# Patient Record
Sex: Male | Born: 1980 | Race: White | Hispanic: No | Marital: Single
Health system: Midwestern US, Community
[De-identification: ages and names within clinical notes are randomized; demographics above are authoritative.]

---

## 2014-02-05 ENCOUNTER — Emergency Department (HOSPITAL_BASED_OUTPATIENT_CLINIC_OR_DEPARTMENT_OTHER)
Admission: EM | Admit: 2014-02-05 | Discharge: 2014-02-05 | Disposition: A | Discharge status: Home / Self Care | Attending: Emergency Medicine | Admitting: Emergency Medicine

## 2014-02-05 ENCOUNTER — Emergency Department (HOSPITAL_BASED_OUTPATIENT_CLINIC_OR_DEPARTMENT_OTHER)

## 2014-02-05 ENCOUNTER — Encounter (HOSPITAL_BASED_OUTPATIENT_CLINIC_OR_DEPARTMENT_OTHER): Payer: Self-pay | Admitting: Emergency Medicine

## 2014-02-05 DIAGNOSIS — R079 Chest pain, unspecified: Secondary | ICD-10-CM

## 2014-02-05 DIAGNOSIS — R0789 Other chest pain: Secondary | ICD-10-CM | POA: Insufficient documentation

## 2014-02-05 DIAGNOSIS — Z791 Long term (current) use of non-steroidal anti-inflammatories (NSAID): Secondary | ICD-10-CM | POA: Diagnosis not present

## 2014-02-05 DIAGNOSIS — Z720 Tobacco use: Secondary | ICD-10-CM | POA: Insufficient documentation

## 2014-02-05 DIAGNOSIS — R52 Pain, unspecified: Secondary | ICD-10-CM

## 2014-02-05 MED ORDER — NAPROXEN 500 MG PO TABS: 500.0000 mg | ORAL_TABLET | Freq: Two times a day (BID) | ORAL | Status: AC

## 2014-02-05 NOTE — ED Notes (Signed)
MD at bedside discussing results with the patient

## 2014-02-05 NOTE — ED Provider Notes (Signed)
CSN: [id number]     Arrival date & time 02/05/14  7829 History   First MD Initiated Contact with Patient 02/05/14 [phone]     Chief Complaint  Patient presents with  . Chest Pain     HPI  Vision presents for evaluation of chest pain. Describes symptoms in January 2. He just moved back from [place]. He is active duty Hotel manager. Pain is brief and intermittent. He states it happens anywhere from every other day to twice per day. Last less than 1 minute at a time to sharp and low parasternal. Not pleuritic. Not predictable. He still runs every morning for his PT without symptoms. No shortness of breath no radiation to his neck back or jaw. No cough sputum or difficulty breathing fever shakes chills. No GI complaints. History reviewed. No pertinent past medical history. History reviewed. No pertinent past surgical history. History reviewed. No pertinent family history. History  Substance Use Topics  . Smoking status: Current Some Day Smoker    Types: Cigarettes    Last Attempt to Quit: 02/03/2014  . Smokeless tobacco: Not on file  . Alcohol Use: No    Review of Systems  Constitutional: Negative for fever, chills, diaphoresis, appetite change and fatigue.  HENT: Negative for mouth sores, sore throat and trouble swallowing.   Eyes: Negative for visual disturbance.  Respiratory: Negative for cough, chest tightness, shortness of breath and wheezing.   Cardiovascular: Positive for chest pain.  Gastrointestinal: Negative for nausea, vomiting, abdominal pain, diarrhea and abdominal distention.  Endocrine: Negative for polydipsia, polyphagia and polyuria.  Genitourinary: Negative for dysuria, frequency and hematuria.  Musculoskeletal: Negative for gait problem.  Skin: Negative for color change, pallor and rash.  Neurological: Negative for dizziness, syncope, light-headedness and headaches.  Hematological: Does not bruise/bleed easily.  Psychiatric/Behavioral: Negative for behavioral problems and  confusion.      Allergies  Review of patient's allergies indicates no known allergies.  Home Medications   Prior to Admission medications   Medication Sig Start Date End Date Taking? Authorizing Provider  naproxen (NAPROSYN) 500 MG tablet Take 1 tablet (500 mg total) by mouth 2 (two) times daily. 02/05/14   Rolland Porter, MD   BP 165/95 mmHg  Pulse 90  Temp(Src) 97.8 F (36.6 C) (Oral)  Resp 16  SpO2 100% Physical Exam  Constitutional: He is oriented to person, place, and time. He appears well-developed and well-nourished. No distress.  HENT:  Head: Normocephalic.  Eyes: Conjunctivae are normal. Pupils are equal, round, and reactive to light. No scleral icterus.  Neck: Normal range of motion. Neck supple. No thyromegaly present.  Cardiovascular: Normal rate and regular rhythm.  Exam reveals no gallop and no friction rub.   No murmur heard. Pulmonary/Chest: Effort normal and breath sounds normal. No respiratory distress. He has no wheezes. He has no rales.    Normal exam. Clear lungs. No crackles or rales.  Abdominal: Soft. Bowel sounds are normal. He exhibits no distension. There is no tenderness. There is no rebound.  Musculoskeletal: Normal range of motion.  Neurological: He is alert and oriented to person, place, and time.  Skin: Skin is warm and dry. No rash noted.  Psychiatric: He has a normal mood and affect. His behavior is normal.    ED Course  Procedures (including critical care time) Labs Review Labs Reviewed - No data to display  Imaging Review Dg Chest 2 View  02/05/2014   CLINICAL DATA:  Dull intermittent chest pain since 01/25/2013, lasting for only a  couple of min. Recently quit smoking.  EXAM: CHEST  2 VIEW  COMPARISON:  None.  FINDINGS: Cardiomediastinal silhouette is within normal limits. The lungs are well inflated with mild bronchovascular thickening bilaterally. No confluent airspace opacity, overt pulmonary edema, pleural effusion, or pneumothorax is  identified. No acute osseous abnormality is identified.  IMPRESSION: Peribronchial thickening which may reflect bronchitis or reactive airways disease.   Electronically Signed   By: Sebastian AcheAllen  Grady   On: 02/05/2014 07:18     EKG Interpretation   Date/Time:  Wednesday February 05 2014 06:48:39 EST Ventricular Rate:  79 PR Interval:  146 QRS Duration: 94 QT Interval:  376 QTC Calculation: 431 R Axis:   45 Text Interpretation:  Normal sinus rhythm Confirmed by [facility]  [facility],  APRIL ([id number]) on 02/05/2014 6:52:19 AM      MDM   Final diagnoses:  Chest wall pain  Chest pain, unspecified chest pain type    Normal exam. Pain is brief. Less than 1 minute at a time. Not predictable. No pain with exertion. Different activity, but no heart or strenuous activity. Continues to be asymptomatic despite running to 3 miles per day. Not a new activity for him. Normal exam negative chest x-ray and EKG. No risk for ACS. No sign of pneumothorax, pneumomediastinum, or pneumonia. Plan will be treatment with anti-inflammatories in attention is symptoms. Recheck with any worsening symptoms.    Rolland PorterMark Marieann Zipp, MD 02/05/14 [phone]

## 2014-02-05 NOTE — Discharge Instructions (Signed)
Chest Wall Pain °Chest wall pain is pain in or around the bones and muscles of your chest. It may take up to 6 weeks to get better. It may take longer if you must stay physically active in your work and activities.  °CAUSES  °Chest wall pain may happen on its own. However, it may be caused by: °· A viral illness like the flu. °· Injury. °· Coughing. °· Exercise. °· Arthritis. °· Fibromyalgia. °· Shingles. °HOME CARE INSTRUCTIONS  °· Avoid overtiring physical activity. Try not to strain or perform activities that cause pain. This includes any activities using your chest or your abdominal and side muscles, especially if heavy weights are used. °· Put ice on the sore area. °· Put ice in a plastic bag. °· Place a towel between your skin and the bag. °· Leave the ice on for 15-20 minutes per hour while awake for the first 2 days. °· Only take over-the-counter or prescription medicines for pain, discomfort, or fever as directed by your caregiver. °SEEK IMMEDIATE MEDICAL CARE IF:  °· Your pain increases, or you are very uncomfortable. °· You have a fever. °· Your chest pain becomes worse. °· You have new, unexplained symptoms. °· You have nausea or vomiting. °· You feel sweaty or lightheaded. °· You have a cough with phlegm (sputum), or you cough up blood. °MAKE SURE YOU:  °· Understand these instructions. °· Will watch your condition. °· Will get help right away if you are not doing well or get worse. °Document Released: 01/10/2005 Document Revised: 04/04/2011 Document Reviewed: 09/06/2010 °ExitCare® Patient Information ©2015 [employer]. This information is not intended to replace advice given to you by your health care provider. Make sure you discuss any questions you have with your health care provider. ° °Chest Pain (Nonspecific) °It is often hard to give a diagnosis for the cause of chest pain. There is always a chance that your pain could be related to something serious, such as a heart attack or a blood clot in  the lungs. You need to follow up with your doctor. °HOME CARE °· If antibiotic medicine was given, take it as directed by your doctor. Finish the medicine even if you start to feel better. °· For the next few days, avoid activities that bring on chest pain. Continue physical activities as told by your doctor. °· Do not use any tobacco products. This includes cigarettes, chewing tobacco, and e-cigarettes. °· Avoid drinking alcohol. °· Only take medicine as told by your doctor. °· Follow your doctor's suggestions for more testing if your chest pain does not go away. °· Keep all doctor visits you made. °GET HELP IF: °· Your chest pain does not go away, even after treatment. °· You have a rash with blisters on your chest. °· You have a fever. °GET HELP RIGHT AWAY IF:  °· You have more pain or pain that spreads to your arm, neck, jaw, back, or belly (abdomen). °· You have shortness of breath. °· You cough more than usual or cough up blood. °· You have very bad back or belly pain. °· You feel sick to your stomach (nauseous) or throw up (vomit). °· You have very bad weakness. °· You pass out (faint). °· You have chills. °This is an emergency. Do not wait to see if the problems will go away. Call your local emergency services (911 in [place] Do not drive yourself to the hospital. °MAKE SURE YOU:  °· Understand these instructions. °· Will watch your   condition. °· Will get help right away if you are not doing well or get worse. °Document Released: 06/29/2007 Document Revised: 01/15/2013 Document Reviewed: 06/29/2007 °ExitCare® Patient Information ©2015 [employer]. This information is not intended to replace advice given to you by your health care provider. Make sure you discuss any questions you have with your health care provider. ° °

## 2014-02-05 NOTE — ED Notes (Signed)
Patient states that he has had "cehst pain " since January 2nd when [place] moved [place], the patient points to his mid epigastric region with the pain

## 2016-07-04 IMAGING — CR DG CHEST 2V
2 series · 2 of 2 positions shown · non-contrast
Comparison: None.

CLINICAL DATA: Dull intermittent chest pain since 01/25/2013,
lasting for only a couple of min. Recently quit smoking.

EXAM:
CHEST  2 VIEW

[w chest pa]
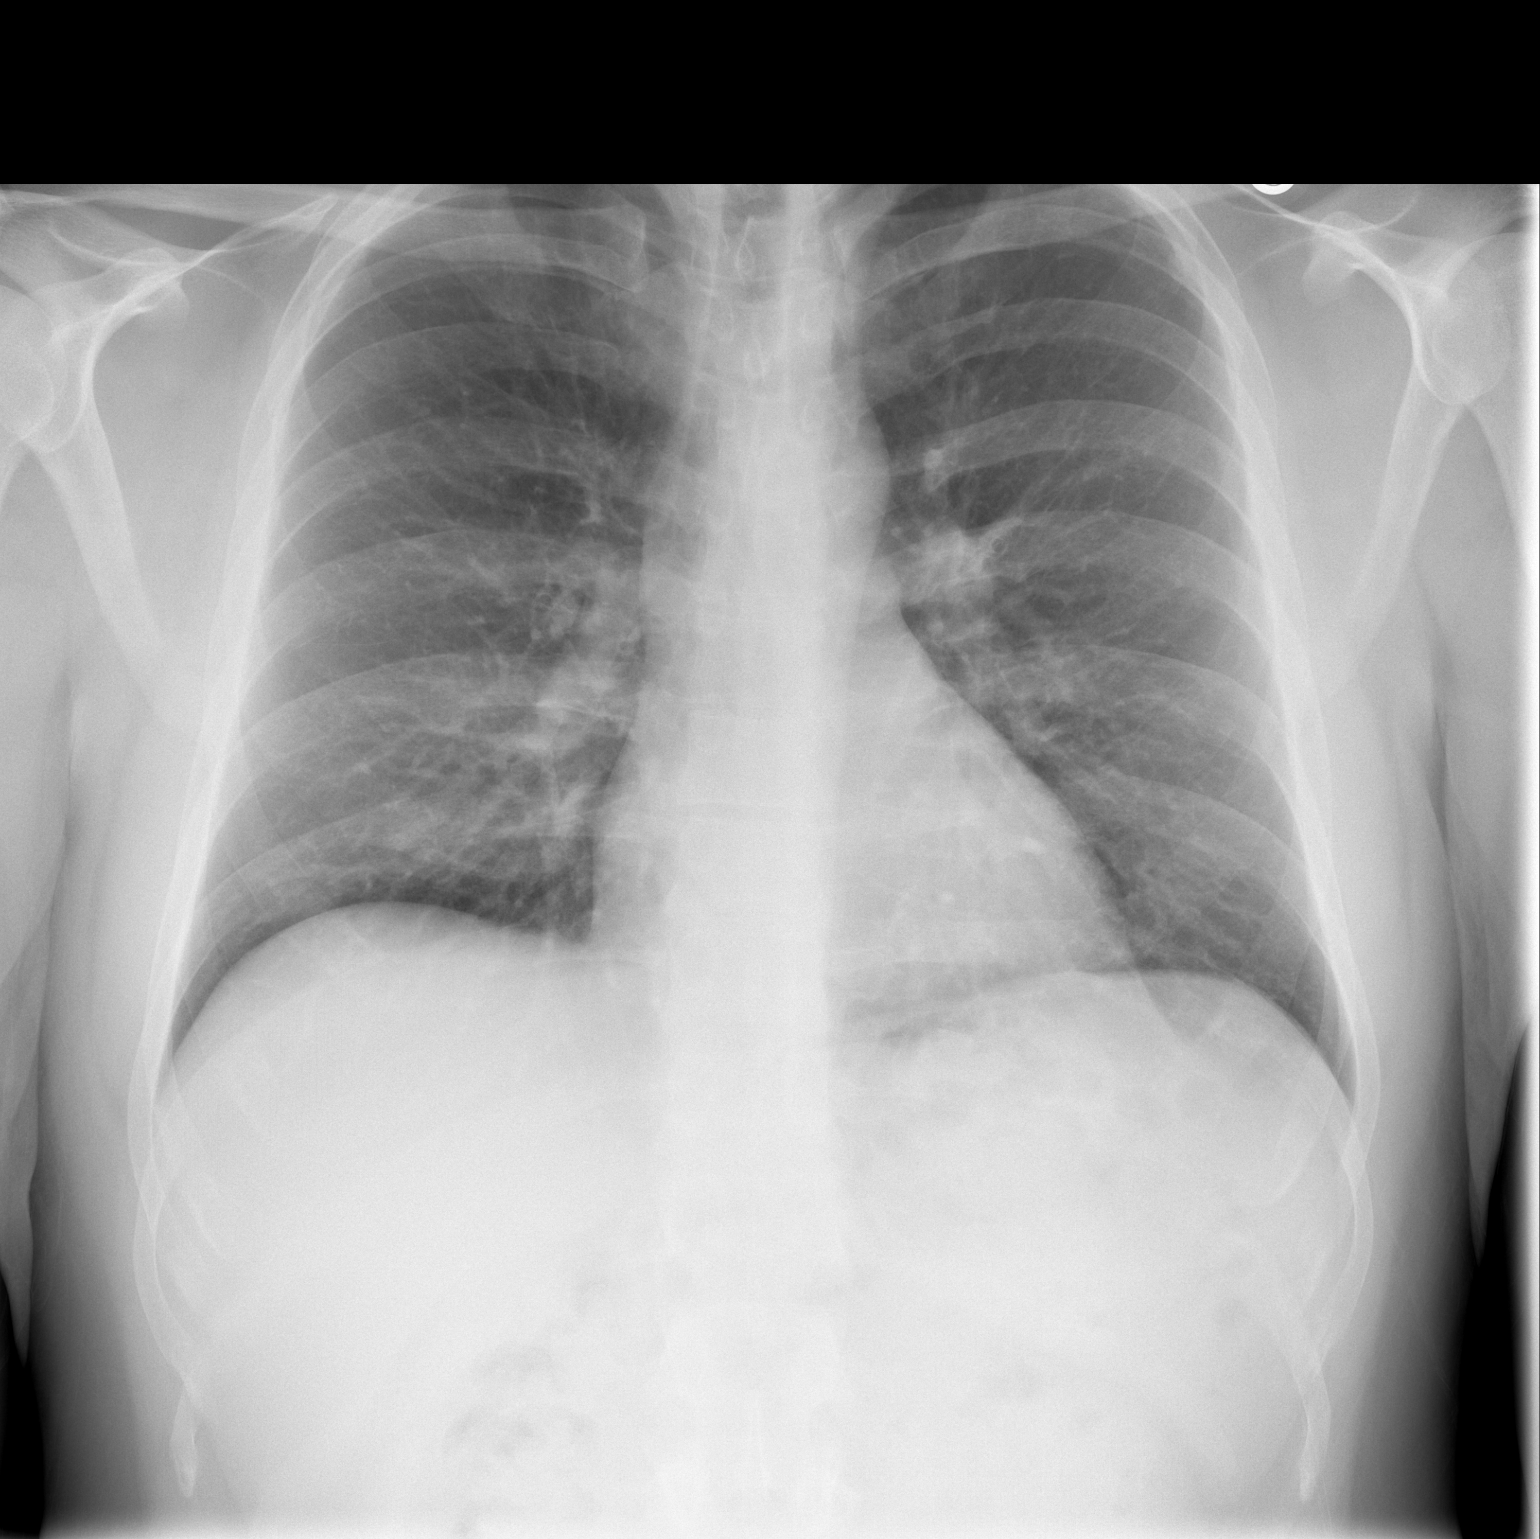

[w chest lat]
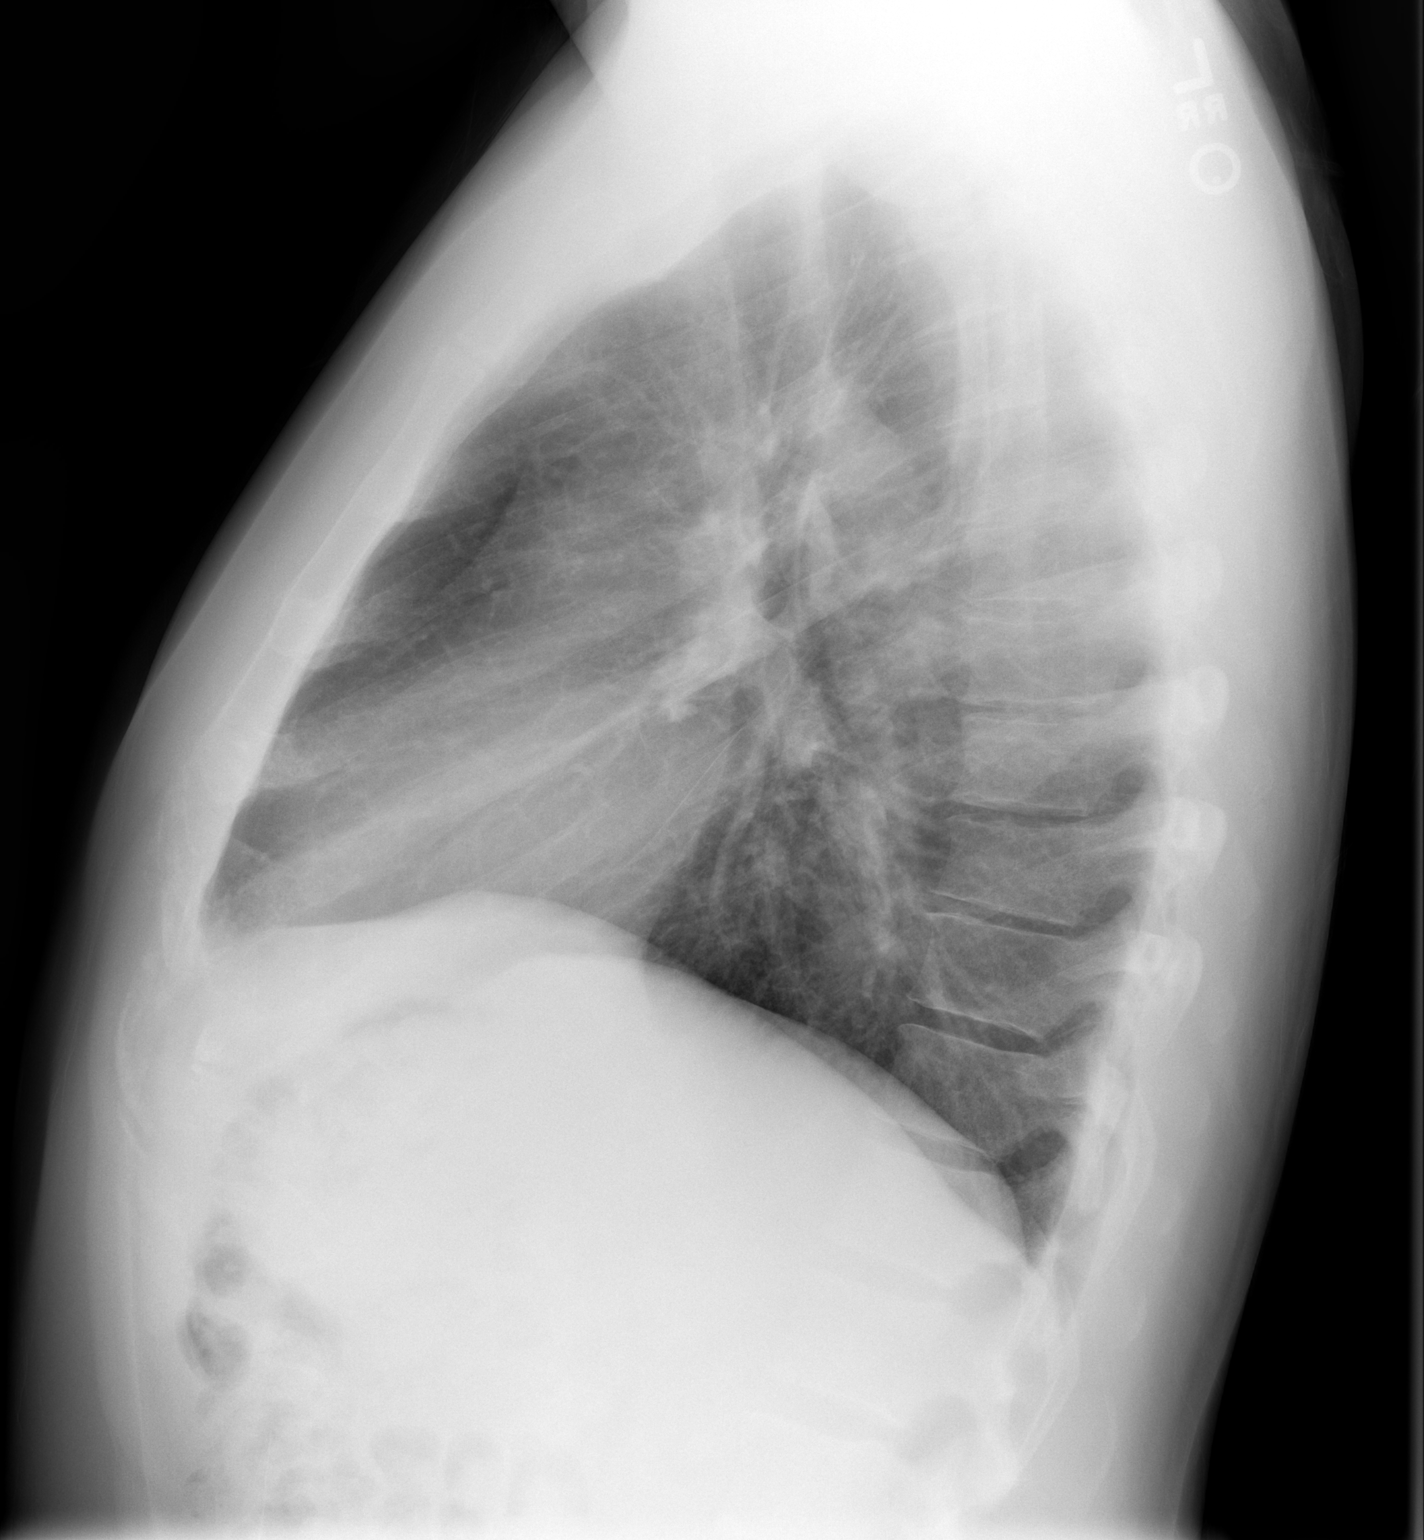

[2 of 2 positions shown; findings below may reference images not displayed]

FINDINGS: Cardiomediastinal silhouette is within normal limits. The lungs are
well inflated with mild bronchovascular thickening bilaterally. No
confluent airspace opacity, overt pulmonary edema, pleural effusion,
or pneumothorax is identified. No acute osseous abnormality is
identified.
IMPRESSION: Peribronchial thickening which may reflect bronchitis or reactive
airways disease.

## 2019-11-21 ENCOUNTER — Inpatient Hospital Stay
Admit: 2019-11-21 | Discharge: 2019-11-21 | Disposition: A | Discharge status: Home / Self Care | Attending: Emergency Medicine

## 2019-11-21 DIAGNOSIS — S0502XA Injury of conjunctiva and corneal abrasion without foreign body, left eye, initial encounter: Secondary | ICD-10-CM

## 2019-11-21 MED ORDER — TETRACAINE HCL 0.5 % OP SOLN
0.5 % | Freq: Once | OPHTHALMIC | Status: AC
Start: 2019-11-21 — End: 2019-11-21
  Administered 2019-11-21: 14:00:00 1 [drp] via OPHTHALMIC

## 2019-11-21 MED ORDER — TOBRAMYCIN 0.3 % OP SOLN
0.3 % | Freq: Four times a day (QID) | OPHTHALMIC | 0 refills | Status: AC
Start: 2019-11-21 — End: 2019-11-26

## 2019-11-21 MED ORDER — FLUORESCEIN SODIUM 1 MG OP STRP
1 MG | Freq: Once | OPHTHALMIC | Status: AC
Start: 2019-11-21 — End: 2019-11-21
  Administered 2019-11-21: 14:00:00 1 via OPHTHALMIC

## 2019-11-21 MED FILL — FUL-GLO 1 MG OP STRP: 1 mg | OPHTHALMIC | Qty: 1

## 2019-11-21 MED FILL — TETRACAINE HCL 0.5 % OP SOLN: 0.5 % | OPHTHALMIC | Qty: 4

## 2019-11-21 NOTE — ED Provider Notes (Signed)
[facility] ED  eMERGENCY dEPARTMENT eNCOUnter      Pt Name: Jared Barnes  MRN: [id number]  Birthdate 1980/10/10  Date of evaluation: 11/21/2019  Provider: Thomes Dinning, MD     CHIEF COMPLAINT       Chief Complaint   Patient presents with   . Eye Injury         HISTORY OF PRESENT ILLNESS   (Location/Symptom, Timing/Onset,Context/Setting, Quality, Duration, Modifying Factors, Severity) Note limiting factors.   HPI    Jared Barnes is a 39 y.o. male who presents to the emergency department with left eye pain from an injury this morning at work.  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  He has had some tearing and discomfort no change in his vision other than some blurriness from tearing.  He has not been sick or ill in any other way.  Last tetanus updated less than 5 years ago.    Nursing Notes were reviewed.    REVIEW OFSYSTEMS    (2+ for level 4; 10+ for level 5)   Review of Systems   Constitutional: Negative for chills, diaphoresis and fever.   HENT: Negative for ear pain and sore throat.    Eyes: Positive for pain and redness. Negative for photophobia, discharge, itching and visual disturbance.   Respiratory: Negative for shortness of breath.    Cardiovascular: Negative for chest pain.   Gastrointestinal: Negative for abdominal pain, diarrhea, nausea and vomiting.   Genitourinary: Negative for dysuria and flank pain.   Musculoskeletal: Negative for back pain and neck pain.   Skin: Negative for rash.   Neurological: Negative for headaches.       PAST MEDICAL HISTORY   No past medical history on file.    SURGICAL HISTORY     No past surgical history on file.    CURRENT MEDICATIONS       Previous Medications    OMEPRAZOLE (PRILOSEC) 10 MG DELAYED RELEASE CAPSULE    Take 10 mg by mouth daily        ALLERGIES     Codeine    FAMILY HISTORY     No family history on file.     SOCIAL HISTORY       Social History     Socioeconomic History   . Marital status: Not on file     Spouse name: Not on file   . Number of children: Not on file   . Years of education: Not on file   . Highest education level: Not on file   Occupational History   . Not on file   Tobacco Use   . Smoking status: Never Smoker   . Smokeless tobacco: Never Used   Vaping Use   . Vaping Use: Never used   Substance and Sexual Activity   . Alcohol use: Yes     Comment: rarely   . Drug use: Never   . Sexual activity: Not on file   Other Topics Concern   . Not on file   Social History Narrative   . Not on file     Social Determinants of Health     Financial Resource Strain:    . Difficulty of Paying Living Expenses:    Food Insecurity:    .  Worried About Programme researcher, broadcasting/film/video in the Last Year:    . Barista in the Last Year:    Transportation Needs:    . Freight forwarder (Medical):    Marland Kitchen Lack of Transportation (Non-Medical):    Physical Activity:    . Days of Exercise per Week:    . Minutes of Exercise per Session:    Stress:    . Feeling of Stress :    Social Connections:    . Frequency of Communication with Friends and Family:    . Frequency of Social Gatherings with Friends and Family:    . Attends Religious Services:    . Active Member of Clubs or Organizations:    . Attends Banker Meetings:    Marland Kitchen Marital Status:    Intimate Partner Violence:    . Fear of Current or Ex-Partner:    . Emotionally Abused:    Marland Kitchen Physically Abused:    . Sexually Abused:        SCREENINGS           PHYSICAL EXAM    (up to 7 for level 4, 8 or more for level 5)     ED Triage Vitals [11/21/19 0929]   BP Temp Temp Source Pulse Resp SpO2 Height Weight   127/85 98 F (36.7 C) Oral 83 18 99 % -- --       Physical Exam  Constitutional:       General: He is not in acute distress.     Appearance: He is well-developed. He is not ill-appearing,  toxic-appearing or diaphoretic.   HENT:      Head: Normocephalic and atraumatic.      Right Ear: External ear normal.      Left Ear: External ear normal.      Nose: Nose normal.      Mouth/Throat:      Mouth: Mucous membranes are moist.      Pharynx: No oropharyngeal exudate.   Eyes:      General: No scleral icterus.        Right eye: No discharge.         Left eye: No discharge.      Extraocular Movements: Extraocular movements intact.      Pupils: Pupils are equal, round, and reactive to light.      Comments: Some conjunctival inflammation, temporal aspect of the left eye sclera.  There is no foreign body seen on visual exam worse with swabbing.  The eye was anesthetized using tetracaine and patient got immediate relief and then stained with fluorescein and examined under the black light reveals a small area of corneal abrasion at the 9 o'clock position consistent with the patient's location of his discomfort.  There is no retained foreign body visualized.  No evidence of corneal rupture or any other globe injury.   Cardiovascular:      Rate and Rhythm: Normal rate and regular rhythm.      Heart sounds: Normal heart sounds. No murmur heard.   No friction rub. No gallop.    Pulmonary:      Effort: Pulmonary effort is normal. No respiratory distress.      Breath sounds: Normal breath sounds. No wheezing or rales.   Abdominal:      General: Bowel sounds are normal. There is no distension.      Palpations: Abdomen is soft. There is no mass.      Tenderness: There is no  abdominal tenderness. There is no guarding or rebound.   Musculoskeletal:         General: No tenderness or deformity.      Cervical back: Neck supple.   Lymphadenopathy:      Cervical: No cervical adenopathy.   Skin:     General: Skin is warm and dry.      Findings: No rash.   Neurological:      Mental Status: He is alert and oriented to person, place, and time.   Psychiatric:         Behavior: Behavior normal.         DIAGNOSTIC RESULTS     EKG (Per  Emergency Physician):     RADIOLOGY (Per Emergency Physician):       Interpretation per the Radiologist below, if available at the time of this note:  No results found.    ED BEDSIDE ULTRASOUND:   Performed by ED Physician - none    LABS:  Labs Reviewed - No data to display     All other labs were within normal range or not returned as of this dictation.    EMERGENCY DEPARTMENT COURSE and DIFFERENTIAL DIAGNOSIS/MDM:   Vitals:    Vitals:    11/21/19 0929   BP: 127/85   Pulse: 83   Resp: 18   Temp: 98 F (36.7 C)   TempSrc: Oral   SpO2: 99%       Medications   tetracaine (TETRAVISC) 0.5 % ophthalmic solution 1 drop (1 drop Left Eye Given 11/21/19 0956)   fluorescein ophthalmic strip 1 mg (1 mg Left Eye Given 11/21/19 0957)       MDM.  Patiently placed on tobramycin ophthalmic solution for his small corneal abrasion we will give him follow-up at [facility] and off work today.  She will return if any problems or concerns.      REVAL:           CONSULTS:  None    PROCEDURES:  Unless otherwise noted below, none     Procedures    FINAL IMPRESSION      1. Injury of conjunctiva and corneal abrasion of left eye without foreign body, initial encounter          DISPOSITION/PLAN   DISPOSITION Decision To Discharge 11/21/2019 10:08:10 AM      PATIENT REFERRED TO:  [facility]  [address]  [place] [address]  [phone]    Call today  For wound re-check      DISCHARGE MEDICATIONS:  New Prescriptions    TOBRAMYCIN (TOBREX) 0.3 % OPHTHALMIC SOLUTION    Place 1 drop into the left eye every 6 hours for 5 days          (Please note:  Portions of this note were completed with a voice recognition program.Efforts were made to edit the dictations but occasionally words and phrases are mis-transcribed.)  Form v2016.J.[id number]    @@ (electronically signed)  Emergency Medicine Provider      Thomes Dinning, MD  11/21/19 1016

## 2020-02-04 ENCOUNTER — Inpatient Hospital Stay
Admit: 2020-02-04 | Discharge: 2020-02-04 | Disposition: A | Discharge status: Home / Self Care | Attending: Emergency Medicine

## 2020-02-04 DIAGNOSIS — R002 Palpitations: Secondary | ICD-10-CM

## 2020-02-04 NOTE — ED Triage Notes (Signed)
Pt c/o inability to sleep for last 3 days d/t hearing heartbeat in ears while laying down. Pt states he has occasional heavier beats that are painful to ears and jaw. Pt denies any chest pain.   Reports that he was diagnosed with covid 12/24 and has slight continued symptoms of cough, body aches and shortness of breath.

## 2020-02-04 NOTE — ED Provider Notes (Signed)
Emergency Department Encounter  [facility] ED    Patient: Jared Barnes  MRN: [id number]  DOB: 03/20/1980  Date of Evaluation: 02/04/2020  ED Provider: Filbert Berthold, MD    Chief Complaint       Chief Complaint   Patient presents with   ??? Other     HOPI     Jared Barnes is a 40 y.o. male who presents to the emergency department complaining of "hearing my heartbeat in my ears sometimes."  Patient recently had COVID and is concerned that he is having palpitations as a result.  He denies any other significant symptoms.  He states that the remainder of his symptoms including cough and fever are all improving.  He has absolutely no discomfort with this.  He has no shortness of breath.    ROS:     At least 4 systems reviewed and otherwise acutely negative except as in the HOPI.    Past History   History reviewed. No pertinent past medical history.  History reviewed. No pertinent surgical history.  Social History     Socioeconomic History   ??? Marital status: Single     Spouse name: None   ??? Number of children: None   ??? Years of education: None   ??? Highest education level: None   Occupational History   ??? None   Tobacco Use   ??? Smoking status: Never Smoker   ??? Smokeless tobacco: Never Used   Vaping Use   ??? Vaping Use: Never used   Substance and Sexual Activity   ??? Alcohol use: Yes     Comment: rarely   ??? Drug use: Never   ??? Sexual activity: None   Other Topics Concern   ??? None   Social History Narrative   ??? None     Social Determinants of Health     Financial Resource Strain:    ??? Difficulty of Paying Living Expenses: Not on file   Food Insecurity:    ??? Worried About Programme researcher, broadcasting/film/video in the Last Year: Not on file   ??? Ran Out of Food in the Last Year: Not on file   Transportation Needs:    ??? Lack of Transportation (Medical): Not on file   ??? Lack of Transportation (Non-Medical): Not on file   Physical Activity:    ??? Days of Exercise per Week: Not on file   ??? Minutes of Exercise per Session: Not on file   Stress:    ???  Feeling of Stress : Not on file   Social Connections:    ??? Frequency of Communication with Friends and Family: Not on file   ??? Frequency of Social Gatherings with Friends and Family: Not on file   ??? Attends Religious Services: Not on file   ??? Active Member of Clubs or Organizations: Not on file   ??? Attends Banker Meetings: Not on file   ??? Marital Status: Not on file   Intimate Partner Violence:    ??? Fear of Current or Ex-Partner: Not on file   ??? Emotionally Abused: Not on file   ??? Physically Abused: Not on file   ??? Sexually Abused: Not on file   Housing Stability:    ??? Unable to Pay for Housing in the Last Year: Not on file   ??? Number of Places Lived in the Last Year: Not on file   ??? Unstable Housing in the Last Year: Not on file  Medications/Allergies     Previous Medications    OMEPRAZOLE (PRILOSEC) 10 MG DELAYED RELEASE CAPSULE    Take 10 mg by mouth daily     Allergies   Allergen Reactions   ??? Codeine         Physical Exam       ED Triage Vitals [02/04/20 0808]   BP Temp Temp Source Pulse Resp SpO2 Height Weight   134/81 98 ??F (36.7 ??C) Oral 84 16 97 % -- --     GENERAL APPEARANCE: This patient is in no acute distress. Awake and alert.  Appears nontoxic.  VITAL SIGNS: As per the triage vitals  HEENT: Normocephalic, atraumatic. Extraocular muscles are intact.   NECK:  Supple  LUNGS: No stridor. Breathing comfortably.  Clear to auscultation bilaterally.  CARDIOVASCULAR: REGULAR rhythm without murmurs.    ABDOMEN: Nontender, soft, nondistended, no palpable pulsatile masses.  MUSCULOSKELETAL: Active range of motion. There is no clubbing, cyanosis, or edema.  NEUROLOGICAL: Awake, alert and oriented x 3.   PSYCH:  Normal mood and affect.  DERMATOLOGIC: No petechiae, rashes, or ecchymoses. Skin has normal temperature. There's no cyanosis, erythema, pallor or edema.        Diagnostics   Labs:  No results found for this visit on 02/04/20.  Radiographs:  No results found.    ED Course and MDM            In brief, Jared Barnes is a 40 y.o. male who presented to the emergency department with concerns for palpitations.    Patient has normal heart rate here.  Regular rhythm on auscultation.  No red flag symptoms noted.  In light of this, I reassured the patient.  We will discharge with outpatient follow-up.    ED Medication Orders (From admission, onward)    None          Final Impression      1. Palpitations with regular cardiac rhythm        DISPOSITION Decision To Discharge 02/04/2020 08:12:04 AM     @[facility] @  (Please note that portions of this note may have been completed with a voice recognition program. Efforts were made to edit the dictations but occasionally words are mis-transcribed.)    , MD  Filbert Berthold [employer]       [place], MD  02/04/20 [phone]

## 2020-02-04 NOTE — Other (Unsigned)
Patient Acct Nbr: [id number]   Primary AUTH/CERT:   Primary Insurance Company Name: [employer]  Primary Insurance Plan name: [facility]  Primary Insurance Group Number:   Primary Insurance Plan Type: Health  Primary Insurance Policy Number: [id number]
